# Patient Record
Sex: Female | Born: 1985 | Race: Black or African American | Hispanic: No | Smoking: Never smoker
Health system: Southern US, Community
[De-identification: ages and names within clinical notes are randomized; demographics above are authoritative.]

## PROBLEM LIST (undated history)

## (undated) ENCOUNTER — Emergency Department (HOSPITAL_COMMUNITY): Admission: EM | Payer: Self-pay | Source: Home / Self Care

## (undated) DIAGNOSIS — J309 Allergic rhinitis, unspecified: Secondary | ICD-10-CM

## (undated) HISTORY — DX: Allergic rhinitis, unspecified: J30.9

## (undated) HISTORY — PX: NO PAST SURGERIES: SHX2092

---
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
## 2005-01-03 ENCOUNTER — Inpatient Hospital Stay (HOSPITAL_COMMUNITY): Admission: AD | Admit: 2005-01-03 | Discharge: 2005-01-03 | Payer: Self-pay | Admitting: Obstetrics

## 2005-04-03 ENCOUNTER — Inpatient Hospital Stay (HOSPITAL_COMMUNITY): Admission: AD | Admit: 2005-04-03 | Discharge: 2005-04-06 | Payer: Self-pay | Admitting: Obstetrics

## 2006-06-23 ENCOUNTER — Other Ambulatory Visit: Admission: RE | Admit: 2006-06-23 | Discharge: 2006-06-23 | Payer: Self-pay | Admitting: Family Medicine

## 2006-07-14 ENCOUNTER — Emergency Department (HOSPITAL_COMMUNITY): Admission: EM | Admit: 2006-07-14 | Discharge: 2006-07-14 | Payer: Self-pay | Admitting: *Deleted

## 2009-01-18 ENCOUNTER — Emergency Department (HOSPITAL_COMMUNITY): Admission: EM | Admit: 2009-01-18 | Discharge: 2009-01-18 | Payer: Self-pay | Admitting: Emergency Medicine

## 2009-07-06 ENCOUNTER — Emergency Department (HOSPITAL_COMMUNITY): Admission: EM | Admit: 2009-07-06 | Discharge: 2009-07-07 | Payer: Self-pay | Admitting: Emergency Medicine

## 2010-02-21 ENCOUNTER — Emergency Department (HOSPITAL_COMMUNITY)
Admission: EM | Admit: 2010-02-21 | Discharge: 2010-02-21 | Payer: Self-pay | Source: Home / Self Care | Admitting: Emergency Medicine

## 2012-04-23 ENCOUNTER — Emergency Department (HOSPITAL_COMMUNITY)
Admission: EM | Admit: 2012-04-23 | Discharge: 2012-04-23 | Disposition: A | Discharge status: Home / Self Care | Payer: BC Managed Care – PPO | Source: Home / Self Care | Attending: Family Medicine | Admitting: Family Medicine

## 2012-04-23 ENCOUNTER — Encounter (HOSPITAL_COMMUNITY): Payer: Self-pay | Admitting: *Deleted

## 2012-04-23 DIAGNOSIS — K047 Periapical abscess without sinus: Secondary | ICD-10-CM

## 2012-04-23 MED ORDER — TRAMADOL HCL 50 MG PO TABS: 50.0000 mg | ORAL_TABLET | Freq: Four times a day (QID) | ORAL | Status: DC | PRN

## 2012-04-23 MED ORDER — PENICILLIN V POTASSIUM 500 MG PO TABS: 500.0000 mg | ORAL_TABLET | Freq: Three times a day (TID) | ORAL | Status: AC

## 2012-04-23 MED ORDER — IBUPROFEN 600 MG PO TABS: 600.0000 mg | ORAL_TABLET | Freq: Three times a day (TID) | ORAL | Status: DC | PRN

## 2012-04-23 NOTE — ED Provider Notes (Signed)
History     CSN: [id number]  Arrival date & time 04/23/12  1234   First MD Initiated Contact with Patient 04/23/12 1253      Chief Complaint  Patient presents with  . Dental Pain    (Consider location/radiation/quality/duration/timing/severity/associated sxs/prior treatment) HPI Comments: 27 y/o female non diabetic. Here c/o left upper dental pain intermitent for 1 week but worse in last 3 days. Pain radiating to left ear. No ear drainage. No face swelling.     History reviewed. No pertinent past medical history.  History reviewed. No pertinent past surgical history.  No family history on file.  History  Substance Use Topics  . Smoking status: Not on file  . Smokeless tobacco: Not on file  . Alcohol Use: Not on file    OB History   Grav Para Term Preterm Abortions TAB SAB Ect Mult Living                  Review of Systems  Constitutional: Negative for fever and chills.  HENT: Positive for ear pain and dental problem. Negative for congestion, sore throat, facial swelling, neck pain and ear discharge.   Respiratory: Negative for cough.   Neurological: Positive for headaches.  All other systems reviewed and are negative.    Allergies  Review of patient's allergies indicates no known allergies.  Home Medications  No current outpatient prescriptions on file.  BP 169/107  Pulse 66  Temp(Src) 98.1 F (36.7 C) (Oral)  Resp 20  SpO2 98%  LMP 04/10/2012  Physical Exam  Nursing note and vitals reviewed. Constitutional: She is oriented to person, place, and time. She appears well-developed and well-nourished. No distress.  HENT:  Head: Normocephalic and atraumatic.  Right Ear: External ear normal.  Left Ear: External ear normal.  Mouth/Throat: Oropharynx is clear and moist. No oropharyngeal exudate.  Left upper posterior molar fractured in posterior face there is periapical erythema and swelling. Area tender to touch.   Neck: Neck supple.  Cardiovascular:  Normal heart sounds.   Pulmonary/Chest: Breath sounds normal.  Lymphadenopathy:    She has no cervical adenopathy.  Neurological: She is alert and oriented to person, place, and time.  Skin: She is not diaphoretic.    ED Course  Procedures (including critical care time)  Labs Reviewed - No data to display No results found.   1. Dental abscess       MDM   Treated with ibuprofen, penicilin V and tramadol. Dentist referral provided.         Sharin Grave, MD 04/24/12 [phone]

## 2012-04-23 NOTE — ED Notes (Signed)
Pt  Reports   l   Side of  Face  painfull  Pt  Reports     It is  Coming from  Dental area      She  Also reports          Some        l earache  As  Well               She  Is  Speaking ion  Complete  sentances  And  Is       In no  Acute  Distress

## 2012-10-17 ENCOUNTER — Emergency Department (HOSPITAL_COMMUNITY)
Admission: EM | Admit: 2012-10-17 | Discharge: 2012-10-17 | Disposition: A | Discharge status: Home / Self Care | Payer: BC Managed Care – PPO | Attending: Emergency Medicine | Admitting: Emergency Medicine

## 2012-10-17 ENCOUNTER — Emergency Department (HOSPITAL_COMMUNITY): Payer: BC Managed Care – PPO

## 2012-10-17 DIAGNOSIS — R040 Epistaxis: Secondary | ICD-10-CM | POA: Insufficient documentation

## 2012-10-17 DIAGNOSIS — Z3202 Encounter for pregnancy test, result negative: Secondary | ICD-10-CM | POA: Insufficient documentation

## 2012-10-17 DIAGNOSIS — S022XXA Fracture of nasal bones, initial encounter for closed fracture: Secondary | ICD-10-CM

## 2012-10-17 DIAGNOSIS — T148XXA Other injury of unspecified body region, initial encounter: Secondary | ICD-10-CM

## 2012-10-17 DIAGNOSIS — IMO0002 Reserved for concepts with insufficient information to code with codable children: Secondary | ICD-10-CM | POA: Insufficient documentation

## 2012-10-17 MED ORDER — HYDROCODONE-ACETAMINOPHEN 5-325 MG PO TABS: 1.0000 | ORAL_TABLET | ORAL | Status: AC | PRN

## 2012-10-17 MED ORDER — HYDROCODONE-ACETAMINOPHEN 5-325 MG PO TABS
1.0000 | ORAL_TABLET | Freq: Once | ORAL | Status: AC
  Administered 2012-10-17: 1 via ORAL
  Filled 2012-10-17: qty 1
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
## 2012-10-21 NOTE — ED Provider Notes (Signed)
Medical screening examination/treatment/procedure(s) were performed by non-physician practitioner and as supervising physician I was immediately available for consultation/collaboration.  Derwood Kaplan, MD 10/21/12 [phone]
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
## 2016-08-07 ENCOUNTER — Encounter (HOSPITAL_COMMUNITY): Payer: Self-pay

## 2016-08-07 ENCOUNTER — Emergency Department (HOSPITAL_COMMUNITY)
Admission: EM | Admit: 2016-08-07 | Discharge: 2016-08-08 | Disposition: A | Discharge status: Home / Self Care | Payer: No Typology Code available for payment source | Attending: Dermatology | Admitting: Dermatology

## 2016-08-07 DIAGNOSIS — Y92410 Unspecified street and highway as the place of occurrence of the external cause: Secondary | ICD-10-CM | POA: Diagnosis not present

## 2016-08-07 DIAGNOSIS — M542 Cervicalgia: Secondary | ICD-10-CM | POA: Insufficient documentation

## 2016-08-07 DIAGNOSIS — Y999 Unspecified external cause status: Secondary | ICD-10-CM | POA: Insufficient documentation

## 2016-08-07 DIAGNOSIS — S3992XA Unspecified injury of lower back, initial encounter: Secondary | ICD-10-CM | POA: Diagnosis present

## 2016-08-07 DIAGNOSIS — M545 Low back pain: Secondary | ICD-10-CM | POA: Diagnosis not present

## 2016-08-07 DIAGNOSIS — Z5321 Procedure and treatment not carried out due to patient leaving prior to being seen by health care provider: Secondary | ICD-10-CM | POA: Diagnosis not present

## 2016-08-07 DIAGNOSIS — Y939 Activity, unspecified: Secondary | ICD-10-CM | POA: Insufficient documentation

## 2016-08-07 DIAGNOSIS — R51 Headache: Secondary | ICD-10-CM | POA: Diagnosis not present

## 2016-08-07 NOTE — ED Notes (Signed)
Patient called 4x, no response.
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
## 2019-05-02 ENCOUNTER — Ambulatory Visit: Payer: Self-pay | Admitting: Family Medicine

## 2019-05-09 ENCOUNTER — Ambulatory Visit: Payer: Medicaid Other | Admitting: Family Medicine

## 2019-05-16 ENCOUNTER — Ambulatory Visit: Payer: Medicaid Other | Attending: Family Medicine | Admitting: Family Medicine

## 2019-05-16 ENCOUNTER — Other Ambulatory Visit: Payer: Self-pay

## 2019-05-16 ENCOUNTER — Encounter: Payer: Self-pay | Admitting: Family Medicine

## 2019-05-16 DIAGNOSIS — M79672 Pain in left foot: Secondary | ICD-10-CM

## 2019-05-16 NOTE — Progress Notes (Signed)
Name and DOB verified C /o L side foot pain when walking/ tried insoles little relief

## 2019-05-16 NOTE — Progress Notes (Signed)
Virtual Visit via Telephone Note  I connected with Raven Hess on 05/16/19 at 10:10 AM EST by telephone and verified that I am speaking with the correct person using two identifiers.   I discussed the limitations, risks, security and privacy concerns of performing an evaluation and management service by telephone and the availability of in person appointments. I also discussed with the patient that there may be a patient responsible charge related to this service. The patient expressed understanding and agreed to proceed.  Patient Location: Home Provider Location: [facility] Others participating in call: none  History of Present Illness:         34 year old female new to the practice with chief complaint of pain in the left foot for a few months.  Patient reports that she has sharp pain that ranges between a 7-10 on a 0-to-10 scale in the heel of her left foot and sometimes in the ankle area.  She reports that her job involves a lot of walking and she believes that this is what has contributed to her foot pain as she walks on very hard concrete floors for up to 8 hours/day.  She currently works in housekeeping.  She has been taking Aleve and Tylenol extra strength and this does cause some decrease in the level of pain.  She has tried changing shoes, as well as obtaining new insoles for her shoes and these measures have not helped so far.  Upon questioning, pain is worse first thing in the morning when she puts her feet on the floor.  She sometimes has no pain if she is not bearing any weight on her feet.  She has talked with other people with similar pain and they stated they have had injections into the heels and they also suggested something to help stretch the bottom of the foot.  Patient reports that she has looked online to see if she can find a product to help with foot stretching.      She reports no significant past medical history other than seasonal allergies.  No current known drug, food  or latex allergy.  No allergy to insect stings.  No prior surgeries.  Family history is significant for mother and maternal aunt with hypertension.  Past Medical History:  Diagnosis Date  . Allergic rhinitis     Past Surgical History:  Procedure Laterality Date  . NO PAST SURGERIES      Family History  Problem Relation Age of Onset  . Hypertension Mother   . Diabetes Neg Hx   . Cancer Neg Hx   . Heart disease Neg Hx     Social History   Tobacco Use  . Smoking status: Never Smoker  . Smokeless tobacco: Never Used  Substance Use Topics  . Alcohol use: No  . Drug use: Not on file     No Known Allergies     Observations/Objective: No vital signs or physical exam conducted as visit was done via telephone  Assessment and Plan: 1. Left foot pain Discussed with the patient that I suspect that she has plantar fasciitis.  She is encouraged to continue the use of Aleve as needed as this will help with pain and inflammation.  She should eat prior to taking the medication.  Also discussed use of household objects such as canned food to use for exercises to stretch left foot to help ease the pain associated with plantar fasciitis.  Also discussed the use of ice massage.  Patient will be  referred to podiatry for further evaluation and treatment. - Ambulatory referral to Podiatry  Follow Up Instructions:Return for Medical issues-as needed; schedule yearly well exam.    I discussed the assessment and treatment plan with the patient. The patient was provided an opportunity to ask questions and all were answered. The patient agreed with the plan and demonstrated an understanding of the instructions.   The patient was advised to call back or seek an in-person evaluation if the symptoms worsen or if the condition fails to improve as anticipated.  I provided 8 minutes of non-face-to-face time during this encounter.     [name], MD

## 2019-05-30 ENCOUNTER — Ambulatory Visit (INDEPENDENT_AMBULATORY_CARE_PROVIDER_SITE_OTHER): Payer: No Typology Code available for payment source

## 2019-05-30 ENCOUNTER — Other Ambulatory Visit: Payer: Self-pay

## 2019-05-30 ENCOUNTER — Encounter: Payer: Self-pay | Admitting: Podiatry

## 2019-05-30 ENCOUNTER — Ambulatory Visit (INDEPENDENT_AMBULATORY_CARE_PROVIDER_SITE_OTHER): Payer: No Typology Code available for payment source | Admitting: Podiatry

## 2019-05-30 VITALS — BP 134/89 | HR 96 | Resp 16

## 2019-05-30 DIAGNOSIS — M722 Plantar fascial fibromatosis: Secondary | ICD-10-CM

## 2019-05-30 DIAGNOSIS — M79671 Pain in right foot: Secondary | ICD-10-CM

## 2019-05-30 DIAGNOSIS — R609 Edema, unspecified: Secondary | ICD-10-CM

## 2019-05-30 DIAGNOSIS — M79672 Pain in left foot: Secondary | ICD-10-CM | POA: Diagnosis not present

## 2019-05-30 MED ORDER — METHYLPREDNISOLONE 4 MG PO TBPK: ORAL_TABLET | ORAL | 0 refills | Status: AC

## 2019-05-30 NOTE — Patient Instructions (Signed)

## 2019-06-05 NOTE — Progress Notes (Signed)
Subjective:   Patient ID: Raven Hess, female   DOB: 34 y.o.   MRN: [id number]   HPI 34 year old female presents the office today for concerns of heel pain on the left side worse than right.  She says it hurts when she first gets up and after being on her feet all day at work.  This has been ongoing for last 2 months.  She has noticed mild swelling.  No redness or warmth.  She denies any recent injury or trauma.  She has been wearing inserts that do help.    Review of Systems  All other systems reviewed and are negative.  Past Medical History:  Diagnosis Date  . Allergic rhinitis     Past Surgical History:  Procedure Laterality Date  . NO PAST SURGERIES       Current Outpatient Medications:  .  HYDROcodone-acetaminophen (NORCO/VICODIN) 5-325 MG per tablet, Take 1 tablet by mouth every 4 (four) hours as needed for pain. (Patient not taking: Reported on 05/16/2019), Disp: 10 tablet, Rfl: 0 .  methylPREDNISolone (MEDROL DOSEPAK) 4 MG TBPK tablet, Take as directed, Disp: 21 tablet, Rfl: 0  No Known Allergies       Objective:  Physical Exam  General: AAO x3, NAD  Dermatological: Skin is warm, dry and supple bilateral. Nails x 10 are well manicured; remaining integument appears unremarkable at this time. There are no open sores, no preulcerative lesions, no rash or signs of infection present.  Vascular: Dorsalis Pedis artery and Posterior Tibial artery pedal pulses are 2/4 bilateral with immedate capillary fill time. There is no pain with calf compression, swelling, warmth, erythema.   Neruologic: Grossly intact via light touch bilateral.  Sensation intact with Semmes Weinstein monofilament.  Negative Tinel sign.  Musculoskeletal:Tenderness to palpation along the plantar medial tubercle of the calcaneus at the insertion of plantar fascia on the left foot. There is no pain along the course of the plantar fascia within the arch of the foot. Plantar fascia appears to be intact. There  is no pain with lateral compression of the calcaneus or pain with vibratory sensation. There is no pain along the course or insertion of the achilles tendon. No other areas of tenderness to bilateral lower extremities. Muscular strength 5/5 in all groups tested bilateral.  Gait: Unassisted, Nonantalgic.       Assessment:   34 year old female bilateral plantar fasciitis left side worse than right     Plan:  -Treatment options discussed including all alternatives, risks, and complications -Etiology of symptoms were discussed -X-rays were obtained and reviewed with the patient.  No evidence of acute fracture or stress fracture identified today. -Steroid injection performed the left foot.  See procedure note below -Bilateral plantar fascial braces dispensed. -Discussed shoe modifications and orthotics -Stretching, icing daily. -Medrol dose pack.     Procedure: Injection Tendon/Ligament Discussed alternatives, risks, complications and verbal consent was obtained.  Location LEFT plantar fascia at the glabrous junction; medial approach. Skin Prep: Alcohol  Injectate: 0.5cc 0.5% marcaine plain, 0.5 cc 2% lidocaine plain and, 1 cc kenalog 10. Disposition: Patient tolerated procedure well. Injection site dressed with a band-aid.  Post-injection care was discussed and return precautions discussed.   Return in about 4 weeks (around 06/27/2019).  Vivi Barrack DPM

## 2019-06-16 ENCOUNTER — Other Ambulatory Visit: Payer: Self-pay | Admitting: Podiatry

## 2019-06-17 ENCOUNTER — Telehealth: Payer: Self-pay | Admitting: Podiatry

## 2019-06-17 MED ORDER — MELOXICAM 15 MG PO TABS: 15.0000 mg | ORAL_TABLET | Freq: Every day | ORAL | 0 refills | Status: DC

## 2019-06-17 NOTE — Telephone Encounter (Signed)
Raven Hess called because she needs accommodations for work. She needs to have time to sit down for foot pain. I also need to know her restrictions and how long the restrictions will be in place. Thank you

## 2019-06-17 NOTE — Telephone Encounter (Signed)
We could do an accomodation where she needs to take a 10 min break every 2 hours in order to giver her foot some time to rest.

## 2019-06-17 NOTE — Addendum Note (Signed)
Addended by: Alphia Kava D on: 06/17/2019 09:40 AM   Modules accepted: Orders

## 2019-06-17 NOTE — Telephone Encounter (Signed)
Left message informing pt Dr. Ardelle Anton did not want to refill the medrol dose pack at this time and ordered an antiinflammatory medication, to make an appt prior to future refills and I would forward her request for work accommodations.

## 2019-06-17 NOTE — Telephone Encounter (Signed)
Lets not do another round of the steroid. Please call in mobic 15mg  1 tab daily disp #30 with no refills. Thanks.

## 2019-06-17 NOTE — Telephone Encounter (Signed)
Left message to call to discuss Dr. Gabriel Rung option for her work schedule and note.

## 2019-06-18 ENCOUNTER — Encounter: Payer: Self-pay | Admitting: Podiatry

## 2019-06-27 ENCOUNTER — Encounter: Payer: Self-pay | Admitting: Podiatry

## 2019-06-27 ENCOUNTER — Other Ambulatory Visit: Payer: Self-pay

## 2019-06-27 ENCOUNTER — Ambulatory Visit (INDEPENDENT_AMBULATORY_CARE_PROVIDER_SITE_OTHER): Payer: No Typology Code available for payment source | Admitting: Podiatry

## 2019-06-27 DIAGNOSIS — M722 Plantar fascial fibromatosis: Secondary | ICD-10-CM | POA: Diagnosis not present

## 2019-06-27 NOTE — Patient Instructions (Signed)

## 2019-07-11 DIAGNOSIS — M722 Plantar fascial fibromatosis: Secondary | ICD-10-CM | POA: Insufficient documentation

## 2019-07-11 NOTE — Progress Notes (Signed)
Subjective: 34 year old female presents the office today for follow-up evaluation of plantar fasciitis with left side worse than right.  She does state that overall she is getting better.  The injection did help for about 2 weeks.  Pain is intermittent.  Plan fascial brace also does help.  She states that she would like to extend intermittent FMLA 12 weeks. Denies any systemic complaints such as fevers, chills, nausea, vomiting. No acute changes since last appointment, and no other complaints at this time.   Objective: AAO x3, NAD DP/PT pulses palpable bilaterally, CRT less than 3 seconds At this time there is mild tenderness palpation on the plantar medial tubercle of the calcaneus insertion of plantar fascia bilaterally left side worse than the right.  There is no pain upon compression of calcaneus.  There is no edema, erythema.  No rating pain or weakness.  Pain is usually worse after being on her feet all day.  No open lesions or pre-ulcerative lesions.  No pain with calf compression, swelling, warmth, erythema  Assessment: Plantar fasciitis, heel pain L>R  Plan: -All treatment options discussed with the patient including all alternatives, risks, complications.  -Discussed second steroid injection today.  Already continue with stretching, icing as well as wearing supportive shoes.  Meloxicam as needed.  We did extend her FMLA that she can work 6 hours/day with an extra 15-minute break.  We will do this for an additional 6 weeks.  -Patient encouraged to call the office with any questions, concerns, change in symptoms.   Vivi Barrack DPM

## 2019-07-18 ENCOUNTER — Telehealth: Payer: Self-pay | Admitting: *Deleted

## 2019-07-18 ENCOUNTER — Other Ambulatory Visit: Payer: Self-pay | Admitting: Podiatry

## 2019-07-18 MED ORDER — MELOXICAM 15 MG PO TABS: 15.0000 mg | ORAL_TABLET | Freq: Every day | ORAL | 0 refills | Status: AC

## 2019-07-18 NOTE — Telephone Encounter (Signed)
Refilled mobic  ?

## 2019-07-27 ENCOUNTER — Encounter: Payer: Self-pay | Admitting: Podiatry

## 2019-08-09 ENCOUNTER — Ambulatory Visit: Payer: No Typology Code available for payment source | Admitting: Podiatry

## 2019-08-13 ENCOUNTER — Encounter: Payer: Self-pay | Admitting: Podiatry

## 2020-04-11 ENCOUNTER — Ambulatory Visit (HOSPITAL_COMMUNITY)
Admission: EM | Admit: 2020-04-11 | Discharge: 2020-04-11 | Disposition: A | Discharge status: Home / Self Care | Payer: Medicaid Other

## 2020-04-11 ENCOUNTER — Other Ambulatory Visit: Payer: Self-pay

## 2020-04-11 ENCOUNTER — Encounter (HOSPITAL_COMMUNITY): Payer: Self-pay

## 2020-04-11 DIAGNOSIS — Z20822 Contact with and (suspected) exposure to covid-19: Secondary | ICD-10-CM

## 2020-04-11 NOTE — ED Triage Notes (Signed)
Pt reports needing a COVID test. She states she has no sxs.

## 2020-04-12 ENCOUNTER — Ambulatory Visit (HOSPITAL_COMMUNITY)
Admission: EM | Admit: 2020-04-12 | Discharge: 2020-04-12 | Disposition: A | Discharge status: Home / Self Care | Payer: Medicaid Other | Attending: Internal Medicine | Admitting: Internal Medicine

## 2020-04-12 ENCOUNTER — Telehealth (HOSPITAL_COMMUNITY): Payer: Self-pay | Admitting: Emergency Medicine

## 2020-04-12 DIAGNOSIS — Z20822 Contact with and (suspected) exposure to covid-19: Secondary | ICD-10-CM | POA: Diagnosis present

## 2020-04-12 LAB — SARS CORONAVIRUS 2 (TAT 6-24 HRS): SARS Coronavirus 2: NEGATIVE

## 2020-04-12 NOTE — ED Triage Notes (Signed)
Patient registered to re-order test from yesterday to be run.

## 2020-04-12 NOTE — Telephone Encounter (Signed)
Attempting to place COVID order, opened in error

## 2020-04-18 ENCOUNTER — Other Ambulatory Visit: Payer: Self-pay | Admitting: Podiatry

## 2020-04-18 NOTE — Telephone Encounter (Signed)
Please advise 

## 2020-04-20 ENCOUNTER — Other Ambulatory Visit: Payer: Self-pay | Admitting: Podiatry

## 2020-04-20 MED ORDER — MELOXICAM 15 MG PO TABS: 15.0000 mg | ORAL_TABLET | Freq: Every day | ORAL | 0 refills | Status: AC

## 2020-04-30 ENCOUNTER — Ambulatory Visit: Payer: Medicaid Other | Admitting: Family Medicine

## 2020-05-02 ENCOUNTER — Ambulatory Visit: Payer: Medicaid Other | Admitting: Physician Assistant

## 2020-06-07 DIAGNOSIS — Z1231 Encounter for screening mammogram for malignant neoplasm of breast: Secondary | ICD-10-CM

## 2021-07-31 ENCOUNTER — Ambulatory Visit
Admission: EM | Admit: 2021-07-31 | Discharge: 2021-07-31 | Disposition: A | Discharge status: Home / Self Care | Payer: Medicaid Other | Attending: Internal Medicine | Admitting: Internal Medicine

## 2021-07-31 DIAGNOSIS — J069 Acute upper respiratory infection, unspecified: Secondary | ICD-10-CM | POA: Diagnosis not present

## 2021-07-31 MED ORDER — BENZONATATE 100 MG PO CAPS: 100.0000 mg | ORAL_CAPSULE | Freq: Three times a day (TID) | ORAL | 0 refills | Status: AC | PRN

## 2021-07-31 MED ORDER — FLUTICASONE PROPIONATE 50 MCG/ACT NA SUSP: 1.0000 | Freq: Every day | NASAL | 0 refills | Status: AC

## 2021-07-31 NOTE — ED Triage Notes (Signed)
Pt c/o runny nose, cough, fatigue

## 2021-07-31 NOTE — ED Provider Notes (Signed)
[facility]    CSN: [id number] Arrival date & time: 07/31/21  1434      History   Chief Complaint Chief Complaint  Patient presents with   Cough    HPI Raven Hess is a 36 y.o. female.   Patient presents with runny nose, cough, fatigue that started yesterday.  Her daughter has had similar symptoms recently.  Denies any known fevers at home.  Patient has taken an over-the-counter cold and flu medications that she is not sure the name of with minimal improvement of symptoms.  Denies chest pain, shortness of breath, sore throat, ear pain, nausea, vomiting, diarrhea, abdominal pain.  Patient also has elevated blood pressure reading but reports that she has not taken her blood pressure medication today.  She is supposed to take amlodipine and hydrochlorothiazide daily but only takes it "sometimes".  Denies chest pain, shortness of breath, headache, dizziness, blurred vision, nausea, vomiting.   Cough  Past Medical History:  Diagnosis Date   Allergic rhinitis     Patient Active Problem List   Diagnosis Date Noted   Plantar fasciitis, bilateral 07/11/2019    Past Surgical History:  Procedure Laterality Date   NO PAST SURGERIES      OB History   No obstetric history on file.      Home Medications    Prior to Admission medications   Medication Sig Start Date End Date Taking? Authorizing Provider  benzonatate (TESSALON) 100 MG capsule Take 1 capsule (100 mg total) by mouth every 8 (eight) hours as needed for cough. 07/31/21  Yes Ariston Grandison, Acie Fredrickson, FNP  fluticasone (FLONASE) 50 MCG/ACT nasal spray Place 1 spray into both nostrils daily for 3 days. 07/31/21 08/03/21 Yes Ferdinand Revoir, Acie Fredrickson, FNP  HYDROcodone-acetaminophen (NORCO/VICODIN) 5-325 MG per tablet Take 1 tablet by mouth every 4 (four) hours as needed for pain. Patient not taking: Reported on 05/16/2019 10/17/12   Teressa Lower, NP  meloxicam (MOBIC) 15 MG tablet Take 1 tablet (15 mg total) by mouth daily.  07/18/19   Vivi Barrack, DPM  methylPREDNISolone (MEDROL DOSEPAK) 4 MG TBPK tablet Take as directed 05/30/19   Vivi Barrack, DPM    Family History Family History  Problem Relation Age of Onset   Hypertension Mother    Diabetes Neg Hx    Cancer Neg Hx    Heart disease Neg Hx     Social History Social History   Tobacco Use   Smoking status: Never   Smokeless tobacco: Never  Substance Use Topics   Alcohol use: No     Allergies   Patient has no known allergies.   Review of Systems Review of Systems Per HPI  Physical Exam Triage Vital Signs ED Triage Vitals [07/31/21 1456]  Enc Vitals Group     BP (!) 169/119     Pulse Rate 84     Resp 18     Temp 98.4 F (36.9 C)     Temp Source Oral     SpO2 95 %     Weight      Height      Head Circumference      Peak Flow      Pain Score 0     Pain Loc      Pain Edu?      Excl. in GC?    No data found.  Updated Vital Signs BP (!) 169/119 (BP Location: Right Arm)   Pulse 84   Temp 98.4 F (  36.9 C) (Oral)   Resp 18   SpO2 95%   Visual Acuity Right Eye Distance:   Left Eye Distance:   Bilateral Distance:    Right Eye Near:   Left Eye Near:    Bilateral Near:     Physical Exam Constitutional:      General: She is not in acute distress.    Appearance: Normal appearance. She is not toxic-appearing or diaphoretic.  HENT:     Head: Normocephalic and atraumatic.     Right Ear: Tympanic membrane and ear canal normal.     Left Ear: Tympanic membrane and ear canal normal.     Nose: Congestion present.     Mouth/Throat:     Mouth: Mucous membranes are moist.     Pharynx: Posterior oropharyngeal erythema present.  Eyes:     Extraocular Movements: Extraocular movements intact.     Conjunctiva/sclera: Conjunctivae normal.     Pupils: Pupils are equal, round, and reactive to light.  Cardiovascular:     Rate and Rhythm: Normal rate and regular rhythm.     Pulses: Normal pulses.     Heart sounds: Normal  heart sounds.  Pulmonary:     Effort: Pulmonary effort is normal. No respiratory distress.     Breath sounds: Normal breath sounds. No wheezing.  Abdominal:     General: Abdomen is flat. Bowel sounds are normal.     Palpations: Abdomen is soft.  Musculoskeletal:        General: Normal range of motion.     Cervical back: Normal range of motion.  Skin:    General: Skin is warm and dry.  Neurological:     General: No focal deficit present.     Mental Status: She is alert and oriented to person, place, and time. Mental status is at baseline.     Cranial Nerves: Cranial nerves 2-12 are intact.     Sensory: Sensation is intact.     Motor: Motor function is intact.     Coordination: Coordination is intact.     Gait: Gait is intact.  Psychiatric:        Mood and Affect: Mood normal.        Behavior: Behavior normal.     UC Treatments / Results  Labs (all labs ordered are listed, but only abnormal results are displayed) Labs Reviewed  NOVEL CORONAVIRUS, NAA    EKG   Radiology No results found.  Procedures Procedures (including critical care time)  Medications Ordered in UC Medications - No data to display  Initial Impression / Assessment and Plan / UC Course  I have reviewed the triage vital signs and the nursing notes.  Pertinent labs & imaging results that were available during my care of the patient were reviewed by me and considered in my medical decision making (see chart for details).     Patient presents with symptoms likely from a viral upper respiratory infection. Differential includes bacterial pneumonia, sinusitis, allergic rhinitis, COVID-19, flu. Do not suspect underlying cardiopulmonary process. Symptoms seem unlikely related to ACS, CHF or COPD exacerbations, pneumonia, pneumothorax. Patient is nontoxic appearing and not in need of emergent medical intervention.  COVID test pending.  Recommended symptom control with over the counter medications.  Patient  sent prescriptions.  Patient has elevated blood pressure reading but has not taken her blood pressure medication.  No signs of hypertensive urgency and neuro exam is normal.  Patient advised to restart her blood pressure medications at previously prescribed dose  and to monitor at home.  Discussed strict follow-up precautions.  Return if symptoms fail to improve in 1-2 weeks or you develop shortness of breath, chest pain, severe headache. Patient states understanding and is agreeable.  Discharged with PCP followup.  Final Clinical Impressions(s) / UC Diagnoses   Final diagnoses:  Viral upper respiratory tract infection with cough     Discharge Instructions      It appears that you have a viral upper respiratory infection that should run its course and self resolve in the next few days with symptomatic treatment.  Please follow-up if symptoms persist or worsen.    ED Prescriptions     Medication Sig Dispense Auth. Provider   fluticasone (FLONASE) 50 MCG/ACT nasal spray Place 1 spray into both nostrils daily for 3 days. 16 g Samar Dass, Rolly SalterHaley E, [place]NP   benzonatate (TESSALON) 100 MG capsule Take 1 capsule (100 mg total) by mouth every 8 (eight) hours as needed for cough. 21 capsule [place], Acie FredricksonHaley E, [place]FNP      PDMP not reviewed this encounter.   Gustavus BryantMound, Ming Kunka E, [place]FNP 07/31/21 1520

## 2021-07-31 NOTE — Discharge Instructions (Addendum)
It appears that you have a viral upper respiratory infection that should run its course and self resolve in the next few days with symptomatic treatment.  Please follow-up if symptoms persist or worsen. 

## 2021-08-01 LAB — NOVEL CORONAVIRUS, NAA: SARS-CoV-2, NAA: NOT DETECTED

## 2022-01-25 ENCOUNTER — Ambulatory Visit
Admission: EM | Admit: 2022-01-25 | Discharge: 2022-01-25 | Disposition: A | Discharge status: Home / Self Care | Payer: Medicaid Other | Attending: Internal Medicine | Admitting: Internal Medicine

## 2022-01-25 DIAGNOSIS — B349 Viral infection, unspecified: Secondary | ICD-10-CM

## 2022-01-25 DIAGNOSIS — Z3202 Encounter for pregnancy test, result negative: Secondary | ICD-10-CM | POA: Diagnosis not present

## 2022-01-25 DIAGNOSIS — R110 Nausea: Secondary | ICD-10-CM

## 2022-01-25 LAB — POCT URINE PREGNANCY: Preg Test, Ur: NEGATIVE

## 2022-01-25 MED ORDER — ONDANSETRON 4 MG PO TBDP: 4.0000 mg | ORAL_TABLET | Freq: Three times a day (TID) | ORAL | 0 refills | Status: AC | PRN

## 2022-01-25 NOTE — ED Triage Notes (Addendum)
Pt presents to uc with co of nausea and "feeling off" weakness fatigue for 3 days. Pt has not attempted any otc medications. Pt also reports mild ha behind the eyes   4/10

## 2022-01-25 NOTE — ED Provider Notes (Signed)
[facility]    CSN: [id number] Arrival date & time: 01/25/22  1030      History   Chief Complaint Chief Complaint  Patient presents with   Nausea    HPI Raven Hess is a 36 y.o. female.   Patient presents with nausea, fatigue, weakness, headache that started yesterday.  Patient reports that she has not had diarrhea but has had more frequent bowel movements.  Denies blood in stool.  Denies any associated vomiting or abdominal pain.  Patient denies any associated upper respiratory symptoms, cough, fever.  Denies any known sick contacts, recent unfavorable foods, travel outside [place].  Last menstrual cycle was approximately 1 month ago but patient is not sure of the exact date.  She has had unprotected sexual intercourse and does not use any form of birth control.     Past Medical History:  Diagnosis Date   Allergic rhinitis     Patient Active Problem List   Diagnosis Date Noted   Plantar fasciitis, bilateral 07/11/2019    Past Surgical History:  Procedure Laterality Date   NO PAST SURGERIES      OB History   No obstetric history on file.      Home Medications    Prior to Admission medications   Medication Sig Start Date End Date Taking? Authorizing Provider  ondansetron (ZOFRAN-ODT) 4 MG disintegrating tablet Take 1 tablet (4 mg total) by mouth every 8 (eight) hours as needed for nausea or vomiting. 01/25/22  Yes Selen Smucker, Rolly Salter E, FNP  benzonatate (TESSALON) 100 MG capsule Take 1 capsule (100 mg total) by mouth every 8 (eight) hours as needed for cough. Patient not taking: Reported on 01/25/2022 07/31/21   Gustavus Bryant, FNP  fluticasone [facility]) 50 MCG/ACT nasal spray Place 1 spray into both nostrils daily for 3 days. Patient not taking: Reported on 01/25/2022 07/31/21 08/03/21  Gustavus Bryant, FNP  HYDROcodone-acetaminophen (NORCO/VICODIN) 5-325 MG per tablet Take 1 tablet by mouth every 4 (four) hours as needed for pain. Patient not  taking: Reported on 05/16/2019 10/17/12   Teressa Lower, NP  meloxicam (MOBIC) 15 MG tablet Take 1 tablet (15 mg total) by mouth daily. Patient not taking: Reported on 01/25/2022 07/18/19   Vivi Barrack, DPM  methylPREDNISolone (MEDROL DOSEPAK) 4 MG TBPK tablet Take as directed Patient not taking: Reported on 01/25/2022 05/30/19   Vivi Barrack, DPM    Family History Family History  Problem Relation Age of Onset   Hypertension Mother    Diabetes Neg Hx    Cancer Neg Hx    Heart disease Neg Hx     Social History Social History   Tobacco Use   Smoking status: Never   Smokeless tobacco: Never  Substance Use Topics   Alcohol use: No     Allergies   Patient has no known allergies.   Review of Systems Review of Systems Per HPI  Physical Exam Triage Vital Signs ED Triage Vitals  Enc Vitals Group     BP 01/25/22 1151 (!) 141/88     Pulse Rate 01/25/22 1151 65     Resp 01/25/22 1151 19     Temp 01/25/22 1151 97.7 F (36.5 C)     Temp Source 01/25/22 1151 Oral     SpO2 01/25/22 1151 98 %     Weight --      Height --      Head Circumference --      Peak Flow --  Pain Score 01/25/22 1149 0     Pain Loc --      Pain Edu? --      Excl. in GC? --    No data found.  Updated Vital Signs BP (!) 141/88   Pulse 65   Temp 97.7 F (36.5 C) (Oral)   Resp 19   LMP  (LMP Unknown)   SpO2 98%   Visual Acuity Right Eye Distance:   Left Eye Distance:   Bilateral Distance:    Right Eye Near:   Left Eye Near:    Bilateral Near:     Physical Exam Constitutional:      General: She is not in acute distress.    Appearance: Normal appearance. She is not toxic-appearing or diaphoretic.  HENT:     Head: Normocephalic and atraumatic.     Mouth/Throat:     Mouth: Mucous membranes are moist.     Pharynx: No posterior oropharyngeal erythema.  Eyes:     Extraocular Movements: Extraocular movements intact.     Conjunctiva/sclera: Conjunctivae normal.   Cardiovascular:     Rate and Rhythm: Normal rate and regular rhythm.     Pulses: Normal pulses.     Heart sounds: Normal heart sounds.  Pulmonary:     Effort: Pulmonary effort is normal. No respiratory distress.     Breath sounds: Normal breath sounds.  Abdominal:     General: Bowel sounds are normal. There is no distension.     Palpations: Abdomen is soft.     Tenderness: There is no abdominal tenderness.  Neurological:     General: No focal deficit present.     Mental Status: She is alert and oriented to person, place, and time. Mental status is at baseline.  Psychiatric:        Mood and Affect: Mood normal.        Behavior: Behavior normal.        Thought Content: Thought content normal.        Judgment: Judgment normal.      UC Treatments / Results  Labs (all labs ordered are listed, but only abnormal results are displayed) Labs Reviewed  POCT URINE PREGNANCY    EKG   Radiology No results found.  Procedures Procedures (including critical care time)  Medications Ordered in UC Medications - No data to display  Initial Impression / Assessment and Plan / UC Course  I have reviewed the triage vital signs and the nursing notes.  Pertinent labs & imaging results that were available during my care of the patient were reviewed by me and considered in my medical decision making (see chart for details).     Urine pregnancy test was negative.  Suspect viral cause of patient's symptoms versus food related illness.  Will prescribe ondansetron to take as needed for nausea.  No signs of dehydration or acute abdomen exam so do not think emergent evaluation is necessary.  Patient advised to encourage clear oral fluids.  Patient advised to follow-up if symptoms persist or worsen.  Patient verbalized understanding and was agreeable with plan. Final Clinical Impressions(s) / UC Diagnoses   Final diagnoses:  Nausea without vomiting  Viral illness  Urine pregnancy test negative      Discharge Instructions      Pregnancy test was negative.  I have prescribed you nausea medication to take as needed.  Please follow-up if symptoms persist or worsen.    ED Prescriptions     Medication Sig Dispense Auth. Provider  ondansetron (ZOFRAN-ODT) 4 MG disintegrating tablet Take 1 tablet (4 mg total) by mouth every 8 (eight) hours as needed for nausea or vomiting. 20 tablet [place], Acie Fredrickson, [place]      PDMP not reviewed this encounter.   Gustavus Bryant, [place] 01/25/22 1319

## 2022-01-25 NOTE — Discharge Instructions (Signed)
Pregnancy test was negative.  I have prescribed you nausea medication to take as needed.  Please follow-up if symptoms persist or worsen.

## 2022-04-11 ENCOUNTER — Encounter (HOSPITAL_BASED_OUTPATIENT_CLINIC_OR_DEPARTMENT_OTHER): Payer: Self-pay | Admitting: Urology

## 2022-04-11 ENCOUNTER — Emergency Department (HOSPITAL_BASED_OUTPATIENT_CLINIC_OR_DEPARTMENT_OTHER): Payer: Medicaid Other

## 2022-04-11 ENCOUNTER — Other Ambulatory Visit: Payer: Self-pay | Admitting: Podiatry

## 2022-04-11 ENCOUNTER — Other Ambulatory Visit: Payer: Self-pay

## 2022-04-11 ENCOUNTER — Emergency Department (HOSPITAL_BASED_OUTPATIENT_CLINIC_OR_DEPARTMENT_OTHER)
Admission: EM | Admit: 2022-04-11 | Discharge: 2022-04-11 | Disposition: A | Discharge status: Home / Self Care | Payer: Medicaid Other | Attending: Emergency Medicine | Admitting: Emergency Medicine

## 2022-04-11 DIAGNOSIS — Y92410 Unspecified street and highway as the place of occurrence of the external cause: Secondary | ICD-10-CM | POA: Diagnosis not present

## 2022-04-11 DIAGNOSIS — R0789 Other chest pain: Secondary | ICD-10-CM | POA: Diagnosis not present

## 2022-04-11 DIAGNOSIS — R079 Chest pain, unspecified: Secondary | ICD-10-CM | POA: Diagnosis present

## 2022-04-11 MED ORDER — CYCLOBENZAPRINE HCL 10 MG PO TABS: 10.0000 mg | ORAL_TABLET | Freq: Two times a day (BID) | ORAL | 0 refills | Status: AC | PRN
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
